# Patient Record
Sex: Female | Born: 1970
Health system: Southern US, Community
[De-identification: ages and names within clinical notes are randomized; demographics above are authoritative.]

## PROBLEM LIST (undated history)

## (undated) HISTORY — PX: ABDOMINAL HYSTERECTOMY: SHX81

---

## 2017-01-22 ENCOUNTER — Emergency Department (HOSPITAL_COMMUNITY)
Admission: EM | Admit: 2017-01-22 | Discharge: 2017-01-22 | Disposition: A | Discharge status: Home / Self Care | Payer: BC Managed Care – PPO | Attending: Emergency Medicine | Admitting: Emergency Medicine

## 2017-01-22 ENCOUNTER — Encounter (HOSPITAL_COMMUNITY): Payer: Self-pay | Admitting: *Deleted

## 2017-01-22 DIAGNOSIS — K029 Dental caries, unspecified: Secondary | ICD-10-CM | POA: Diagnosis not present

## 2017-01-22 DIAGNOSIS — F17200 Nicotine dependence, unspecified, uncomplicated: Secondary | ICD-10-CM | POA: Diagnosis not present

## 2017-01-22 DIAGNOSIS — K047 Periapical abscess without sinus: Secondary | ICD-10-CM | POA: Insufficient documentation

## 2017-01-22 DIAGNOSIS — K0889 Other specified disorders of teeth and supporting structures: Secondary | ICD-10-CM | POA: Diagnosis present

## 2017-01-22 MED ORDER — AMOXICILLIN 500 MG PO CAPS
500.0000 mg | ORAL_CAPSULE | Freq: Once | ORAL | Status: AC
  Administered 2017-01-22: 500 mg via ORAL
  Filled 2017-01-22: qty 1

## 2017-01-22 MED ORDER — HYDROCODONE-ACETAMINOPHEN 5-325 MG PO TABS
1.0000 | ORAL_TABLET | Freq: Once | ORAL | Status: AC
  Administered 2017-01-22: 1 via ORAL
  Filled 2017-01-22: qty 1

## 2017-01-22 MED ORDER — AMOXICILLIN 500 MG PO CAPS: 500.0000 mg | ORAL_CAPSULE | Freq: Three times a day (TID) | ORAL | 0 refills | Status: DC

## 2017-01-22 NOTE — ED Triage Notes (Signed)
Pt complains of left upper dental pain, worse since yesterday. Pt states pain is making her left ear and eyes hurt.

## 2017-01-22 NOTE — ED Notes (Signed)
NP made aware of patient's trending BP. Per NP request, patient educated on hypertension and told to follow up with PCP and have blood pressure rechecked.

## 2017-01-22 NOTE — ED Provider Notes (Signed)
[facility]-EMERGENCY DEPT Provider Note   CSN: [id number] Arrival date & time: 01/22/17  1538     History   Chief Complaint Chief Complaint  Patient presents with  . Dental Pain    HPI Latasha Hernandez is a 46 y.o. female who presents to the ED with dental pain. The pain is located in the left upper dental area. Pain radiates to the left ear. The pain hs been off and on for a couple weeks but controlled with tylenol and ibuprofen. Over the past 24 hours pain has increased.   HPI  History reviewed. No pertinent past medical history.  There are no active problems to display for this patient.   Past Surgical History:  Procedure Laterality Date  . ABDOMINAL HYSTERECTOMY      OB History    No data available       Home Medications    Prior to Admission medications   Medication Sig Start Date End Date Taking? Authorizing Provider  amoxicillin (AMOXIL) 500 MG capsule Take 1 capsule (500 mg total) by mouth 3 (three) times daily. 01/22/17   Janne NapoleonNeese, Jordane Hisle M, NP    Family History No family history on file.  Social History Social History   Tobacco Use  . Smoking status: Current Every Day Smoker  . Smokeless tobacco: Never Used  Substance Use Topics  . Alcohol use: Yes  . Drug use: No     Allergies   Patient has no allergy information on record.   Review of Systems Review of Systems  Constitutional: Negative for chills and fever.  HENT: Positive for dental problem and ear pain.   Respiratory: Negative for cough.   Gastrointestinal: Negative for nausea and vomiting.  Musculoskeletal: Negative for neck pain.  Skin: Negative for wound.  Neurological: Positive for headaches.  Hematological: Positive for adenopathy.  Psychiatric/Behavioral: Negative for confusion.     Physical Exam Updated Vital Signs BP (!) 163/104 (BP Location: Left Arm)   Pulse 78   Temp 98.9 F (37.2 C) (Oral)   Resp 14   Ht 5\' 2"  (1.575 m)   Wt 77.6 kg  (171 lb)   SpO2 96%   BMI 31.28 kg/m   Physical Exam  Constitutional: She is oriented to person, place, and time. She appears well-developed and well-nourished. No distress.  HENT:  Head: Normocephalic.  Right Ear: Tympanic membrane normal.  Left Ear: Tympanic membrane normal.  Nose: Nose normal.  Mouth/Throat: Uvula is midline, oropharynx is clear and moist and mucous membranes are normal. No trismus in the jaw. Dental caries present.    Second upper left molar with decay and tenderness. There is swelling to the gum surrounding the tooth.   Eyes: Conjunctivae and EOM are normal. Pupils are equal, round, and reactive to light.  Neck: Normal range of motion. Neck supple.  Cardiovascular: Normal rate and regular rhythm.  Pulmonary/Chest: Effort normal and breath sounds normal.  Abdominal: Soft. There is no tenderness.  Musculoskeletal: Normal range of motion.  Lymphadenopathy:    She has cervical adenopathy.  Neurological: She is alert and oriented to person, place, and time. No cranial nerve deficit.  Skin: Skin is warm and dry.  Psychiatric: She has a normal mood and affect. Her behavior is normal.  Nursing note and vitals reviewed.    ED Treatments / Results  Labs (all labs ordered are listed, but only abnormal results are displayed) Labs Reviewed - No data to display   Radiology No results found.  Procedures Procedures (including critical care time)  Medications Ordered in ED Medications  amoxicillin (AMOXIL) capsule 500 mg (not administered)  HYDROcodone-acetaminophen (NORCO/VICODIN) 5-325 MG per tablet 1 tablet (not administered)     Initial Impression / Assessment and Plan / ED Course  I have reviewed the triage vital signs and the nursing notes. Patient with toothache.  No gross abscess.  Exam unconcerning for Ludwig's angina or spread of infection.  Will treat with amoxicillin and anti-inflammatories medicine.  Urged patient to follow-up with dentist.     Final Clinical Impressions(s) / ED Diagnoses   Final diagnoses:  Infected dental caries    ED Discharge Orders        Ordered    amoxicillin (AMOXIL) 500 MG capsule  3 times daily     01/22/17 1720       Kerrie Buffaloeese, Cabria Micalizzi Point RobertsM, NP 01/22/17 1726    Charlynne PanderYao, David Hsienta, MD 01/23/17 1459

## 2017-01-22 NOTE — Discharge Instructions (Signed)
Take tylenol and ibuprofen in addition to the antibiotic. Follow up with a dentist as soon as possible. Return here as needed.

## 2018-02-28 ENCOUNTER — Encounter (HOSPITAL_COMMUNITY): Payer: Self-pay

## 2018-02-28 ENCOUNTER — Emergency Department (HOSPITAL_COMMUNITY): Payer: BC Managed Care – PPO

## 2018-02-28 ENCOUNTER — Other Ambulatory Visit: Payer: Self-pay

## 2018-02-28 ENCOUNTER — Emergency Department (HOSPITAL_COMMUNITY)
Admission: EM | Admit: 2018-02-28 | Discharge: 2018-02-28 | Disposition: A | Discharge status: Home / Self Care | Payer: BC Managed Care – PPO | Attending: Emergency Medicine | Admitting: Emergency Medicine

## 2018-02-28 DIAGNOSIS — E079 Disorder of thyroid, unspecified: Secondary | ICD-10-CM

## 2018-02-28 DIAGNOSIS — F17200 Nicotine dependence, unspecified, uncomplicated: Secondary | ICD-10-CM | POA: Diagnosis not present

## 2018-02-28 DIAGNOSIS — R0789 Other chest pain: Secondary | ICD-10-CM | POA: Diagnosis not present

## 2018-02-28 DIAGNOSIS — E0789 Other specified disorders of thyroid: Secondary | ICD-10-CM

## 2018-02-28 LAB — COMPREHENSIVE METABOLIC PANEL
ALT: 17 U/L (ref 0–44)
AST: 21 U/L (ref 15–41)
Albumin: 3.4 g/dL — ABNORMAL LOW (ref 3.5–5.0)
Alkaline Phosphatase: 35 U/L — ABNORMAL LOW (ref 38–126)
Anion gap: 8 (ref 5–15)
BILIRUBIN TOTAL: 0.8 mg/dL (ref 0.3–1.2)
BUN: 9 mg/dL (ref 6–20)
CO2: 25 mmol/L (ref 22–32)
Calcium: 8.1 mg/dL — ABNORMAL LOW (ref 8.9–10.3)
Chloride: 106 mmol/L (ref 98–111)
Creatinine, Ser: 0.67 mg/dL (ref 0.44–1.00)
GFR calc Af Amer: >60 mL/min (ref >60)
GFR calc non Af Amer: >60 mL/min (ref >60)
Glucose, Bld: 99 mg/dL (ref 70–99)
POTASSIUM: 3.1 mmol/L — AB (ref 3.5–5.1)
Sodium: 139 mmol/L (ref 135–145)
TOTAL PROTEIN: 6.7 g/dL (ref 6.5–8.1)

## 2018-02-28 LAB — CBC WITH DIFFERENTIAL/PLATELET
ABS IMMATURE GRANULOCYTES: 0.01 10*3/uL (ref 0.00–0.07)
BASOS ABS: 0 10*3/uL (ref 0.0–0.1)
Basophils Relative: 0 %
EOS PCT: 6 %
Eosinophils Absolute: 0.3 10*3/uL (ref 0.0–0.5)
HEMATOCRIT: 39.5 % (ref 36.0–46.0)
HEMOGLOBIN: 12.9 g/dL (ref 12.0–15.0)
IMMATURE GRANULOCYTES: 0 %
LYMPHS ABS: 1.4 10*3/uL (ref 0.7–4.0)
LYMPHS PCT: 28 %
MCH: 29.1 pg (ref 26.0–34.0)
MCHC: 32.7 g/dL (ref 30.0–36.0)
MCV: 89.2 fL (ref 80.0–100.0)
Monocytes Absolute: 0.7 10*3/uL (ref 0.1–1.0)
Monocytes Relative: 14 %
NRBC: 0 % (ref 0.0–0.2)
Neutro Abs: 2.6 10*3/uL (ref 1.7–7.7)
Neutrophils Relative %: 52 %
Platelets: 222 10*3/uL (ref 150–400)
RBC: 4.43 MIL/uL (ref 3.87–5.11)
RDW: 12.8 % (ref 11.5–15.5)
WBC: 5.1 10*3/uL (ref 4.0–10.5)

## 2018-02-28 LAB — URINALYSIS, ROUTINE W REFLEX MICROSCOPIC
BACTERIA UA: NONE SEEN
Bilirubin Urine: NEGATIVE
Glucose, UA: NEGATIVE mg/dL
Ketones, ur: 5 mg/dL — AB
Leukocytes, UA: NEGATIVE
NITRITE: NEGATIVE
Protein, ur: NEGATIVE mg/dL
Specific Gravity, Urine: 1.008 (ref 1.005–1.030)
pH: 7 (ref 5.0–8.0)

## 2018-02-28 LAB — LIPASE, BLOOD: Lipase: 32 U/L (ref 11–51)

## 2018-02-28 LAB — D-DIMER, QUANTITATIVE (NOT AT ARMC): D DIMER QUANT: 0.6 ug{FEU}/mL — AB (ref 0.00–0.50)

## 2018-02-28 LAB — TROPONIN I: Troponin I: <0.03 ng/mL (ref <0.03)

## 2018-02-28 MED ORDER — IOPAMIDOL (ISOVUE-370) INJECTION 76%
INTRAVENOUS | Status: AC
  Filled 2018-02-28: qty 100

## 2018-02-28 MED ORDER — SODIUM CHLORIDE (PF) 0.9 % IJ SOLN
INTRAMUSCULAR | Status: AC
  Filled 2018-02-28: qty 50

## 2018-02-28 MED ORDER — IOPAMIDOL (ISOVUE-370) INJECTION 76%
100.0000 mL | Freq: Once | INTRAVENOUS | Status: AC | PRN
  Administered 2018-02-28: 100 mL via INTRAVENOUS

## 2018-02-28 NOTE — ED Notes (Signed)
Bed: WA03 Expected date: 02/28/18 Expected time: 7:18 AM Means of arrival: Ambulance Comments: 47 yr chest pain/back pain

## 2018-02-28 NOTE — ED Triage Notes (Signed)
EMS reports from [facility], c/o right sided chest pain and back pain, woke her up from sleep at 0400. Pt states she had cough for several days prior and pain increases with inspiration.  BP 178/115 HR 82 RR 18 Sp02 98 RA  20 L wrist 324 ASA 0.4 mg Nitro with minimal relief

## 2018-02-28 NOTE — Discharge Instructions (Addendum)
As discussed, today's evaluation has been generally reassuring. There are outstanding laboratory studies being performed, and you will be made aware of abnormal findings. Today's evaluation also demonstrated possibility of an abnormality on your thyroid gland. It is important that you follow-up with either your physician or at our primary care clinic to ensure appropriate ongoing evaluation. Please be sure to schedule follow-up with your physician, within the week for further monitoring, management, or return here for any concerning changes in your condition.

## 2018-02-28 NOTE — ED Provider Notes (Signed)
[facility]-EMERGENCY DEPT Provider Note   CSN: [id number] Arrival date & time: 02/28/18  4098     History   Chief Complaint Chief Complaint  Patient presents with  . Chest Pain    HPI Latasha Hernandez is a 48 y.o. female.  HPI Patient presents with right-sided chest pain. The pain woke her from sleeping about 5 hours prior to ED arrival. Pain is right-sided, worse with inspiration, worse with motion, sore, severe, radiating from anterior to posterior. No new fever. She has had a mild cough. Patient denies medical problems, aside from smoking. Notably, the patient has positive contact with an individual who is reportedly positive for tuberculosis Patient also works in a jail. Since onset earlier today, no relief with anything, though it is unclear if she is tried anything specifically. History reviewed. No pertinent past medical history.  There are no active problems to display for this patient.   Past Surgical History:  Procedure Laterality Date  . ABDOMINAL HYSTERECTOMY       OB History   No obstetric history on file.      Home Medications    Prior to Admission medications   Medication Sig Start Date End Date Taking? Authorizing Provider  fluticasone (FLONASE) 50 MCG/ACT nasal spray Place 2 sprays into both nostrils daily as needed for allergies or rhinitis.   Yes [provider]  ibuprofen (ADVIL,MOTRIN) 200 MG tablet Take 600 mg by mouth every 6 (six) hours as needed for headache.   Yes [provider]  amoxicillin (AMOXIL) 500 MG capsule Take 1 capsule (500 mg total) by mouth 3 (three) times daily. Patient not taking: Reported on 02/28/2018 01/22/17   Janne Napoleon, NP    Family History History reviewed. No pertinent family history.  Social History Social History   Tobacco Use  . Smoking status: Current Every Day Smoker  . Smokeless tobacco: Never Used  Substance Use Topics  . Alcohol use: Yes  . Drug  use: No     Allergies   Patient has no known allergies.   Review of Systems Review of Systems  Constitutional:       Per HPI, otherwise negative  HENT:       Per HPI, otherwise negative  Respiratory:       Per HPI, otherwise negative  Cardiovascular:       Per HPI, otherwise negative  Gastrointestinal: Negative for vomiting.  Endocrine:       Negative aside from HPI  Genitourinary:       Neg aside from HPI   Musculoskeletal:       Per HPI, otherwise negative  Skin: Negative.   Neurological: Negative for syncope.     Physical Exam Updated Vital Signs BP (!) 189/103   Pulse 62   Temp 97.6 F (36.4 C) (Oral)   Resp 18   SpO2 99%   Physical Exam Vitals signs and nursing note reviewed.  Constitutional:      General: She is not in acute distress.    Appearance: She is well-developed.  HENT:     Head: Normocephalic and atraumatic.  Eyes:     Conjunctiva/sclera: Conjunctivae normal.  Cardiovascular:     Rate and Rhythm: Normal rate and regular rhythm.  Pulmonary:     Effort: Pulmonary effort is normal. No respiratory distress.     Breath sounds: Normal breath sounds. No stridor.  Abdominal:     General: There is no distension.  Skin:    General:  Skin is warm and dry.  Neurological:     Mental Status: She is alert and oriented to person, place, and time.     Cranial Nerves: No cranial nerve deficit.      ED Treatments / Results  Labs (all labs ordered are listed, but only abnormal results are displayed) Labs Reviewed  COMPREHENSIVE METABOLIC PANEL - Abnormal; Notable for the following components:      Result Value   Potassium 3.1 (*)    Calcium 8.1 (*)    Albumin 3.4 (*)    Alkaline Phosphatase 35 (*)    All other components within normal limits  URINALYSIS, ROUTINE W REFLEX MICROSCOPIC - Abnormal; Notable for the following components:   Color, Urine STRAW (*)    Hgb urine dipstick SMALL (*)    Ketones, ur 5 (*)    All other components within  normal limits  D-DIMER, QUANTITATIVE (NOT [facility]) - Abnormal; Notable for the following components:   D-Dimer, Quant 0.60 (*)    All other components within normal limits  LIPASE, BLOOD  TROPONIN I  CBC WITH DIFFERENTIAL/PLATELET  QUANTIFERON-TB GOLD PLUS    EKG EKG Interpretation  Date/Time:  Tuesday February 28 2018 07:26:18 EST Ventricular Rate:  73 PR Interval:    QRS Duration: 105 QT Interval:  452 QTC Calculation: 499 R Axis:   54 Text Interpretation:  Sinus rhythm Nonspecific T abnormalities, anterior leads Borderline prolonged QT interval Abnormal ekg Confirmed by Gerhard MunchLockwood, Sulaiman Imbert [phone]([id number]) on 02/28/2018 7:30:47 AM   Radiology Dg Chest 2 View  Result Date: 02/28/2018 CLINICAL DATA:  Right-sided chest pain for 6 hours EXAM: CHEST - 2 VIEW COMPARISON:  None. FINDINGS: Normal heart size and mediastinal contours. No acute infiltrate or edema. No effusion or pneumothorax. No acute osseous findings. Remote left sixth rib fracture. IMPRESSION: Negative chest. Electronically Signed   By: Marnee SpringJonathon  Watts M.D.   On: 02/28/2018 08:25   Ct Angio Chest Pe W And/or Wo Contrast  Result Date: 02/28/2018 CLINICAL DATA:  Chest pain. EXAM: CT ANGIOGRAPHY CHEST WITH CONTRAST TECHNIQUE: Multidetector CT imaging of the chest was performed using the standard protocol during bolus administration of intravenous contrast. Multiplanar CT image reconstructions and MIPs were obtained to evaluate the vascular anatomy. CONTRAST:  100mL ISOVUE-370 IOPAMIDOL (ISOVUE-370) INJECTION 76% COMPARISON:  Radiographs of same day. FINDINGS: Cardiovascular: Satisfactory opacification of the pulmonary arteries to the segmental level. No evidence of pulmonary embolism. Normal heart size. No pericardial effusion. Mediastinum/Nodes: The esophagus is unremarkable. Calcified mediastinal lymph nodes are noted concerning for histoplasmosis or granulomatous disease. Large right thyroid lobe or mass is noted which may represent  goiter. This causes tracheal deviation to the left. Lungs/Pleura: Lungs are clear. No pleural effusion or pneumothorax. Upper Abdomen: No acute abnormality. Musculoskeletal: No chest wall abnormality. No acute or significant osseous findings. Review of the MIP images confirms the above findings. IMPRESSION: No definite evidence of pulmonary embolus. Large right thyroid lobe is noted which causes tracheal deviation of the left and may represent goiter, but thyroid ultrasound is recommended to rule out large mass. Calcified mediastinal adenopathy is noted most consistent with prior granulomatous disease. Electronically Signed   By: Lupita RaiderJames  Green Jr, M.D.   On: 02/28/2018 10:53    Procedures Procedures (including critical care time)  Medications Ordered in ED Medications  sodium chloride (PF) 0.9 % injection (has no administration in time range)  iopamidol (ISOVUE-370) 76 % injection (has no administration in time range)  iopamidol (ISOVUE-370) 76 % injection 100  mL (100 mLs Intravenous Contrast Given 02/28/18 1026)     Initial Impression / Assessment and Plan / ED Course  I have reviewed the triage vital signs and the nursing notes.  Pertinent labs & imaging results that were available during my care of the patient were reviewed by me and considered in my medical decision making (see chart for details).     11:34 AM Patient in no distress, states that she feels better. Labs, CT, x-ray reviewed with her at length. No x-ray, CT evidence for pneumonia, PE, pneumothorax. No lab evidence for ACS, though she has mild hypertension, labs are otherwise reassuring, as her vitals. Patient is found to have possible thyroid abnormality, for which she will follow-up. Acknowledges importance of performing this follow-up.  Given resolution of symptoms, generally reassuring findings aside from hypertension, possible thyroid abnormality, patient was discharged in stable condition. Tuberculosis test results not  available, patient also understands importance of awaiting these results, follow-up as needed.  Final Clinical Impressions(s) / ED Diagnoses  Atypical chest pain Thyroid lesion   Gerhard Munch, MD 02/28/18 1135

## 2018-03-02 ENCOUNTER — Encounter: Payer: Self-pay | Admitting: Critical Care Medicine

## 2018-03-02 LAB — QUANTIFERON-TB GOLD PLUS (RQFGPL)
QUANTIFERON TB2 AG VALUE: 0.07 [IU]/mL
QuantiFERON Mitogen Value: >10 IU/mL
QuantiFERON Nil Value: 0.11 IU/mL
QuantiFERON TB1 Ag Value: 0.11 IU/mL

## 2018-03-02 LAB — QUANTIFERON-TB GOLD PLUS: QUANTIFERON-TB GOLD PLUS: NEGATIVE

## 2018-03-21 ENCOUNTER — Encounter: Payer: Self-pay | Admitting: Critical Care Medicine

## 2018-03-21 ENCOUNTER — Ambulatory Visit: Payer: BC Managed Care – PPO | Attending: Critical Care Medicine | Admitting: Critical Care Medicine

## 2018-03-21 VITALS — BP 178/118 | HR 79 | Temp 98.3°F | Ht 62.0 in | Wt 183.6 lb

## 2018-03-21 DIAGNOSIS — Z13220 Encounter for screening for lipoid disorders: Secondary | ICD-10-CM | POA: Diagnosis not present

## 2018-03-21 DIAGNOSIS — R3589 Other polyuria: Secondary | ICD-10-CM

## 2018-03-21 DIAGNOSIS — K029 Dental caries, unspecified: Secondary | ICD-10-CM

## 2018-03-21 DIAGNOSIS — R358 Other polyuria: Secondary | ICD-10-CM

## 2018-03-21 DIAGNOSIS — E049 Nontoxic goiter, unspecified: Secondary | ICD-10-CM

## 2018-03-21 DIAGNOSIS — F17200 Nicotine dependence, unspecified, uncomplicated: Secondary | ICD-10-CM

## 2018-03-21 DIAGNOSIS — K056 Periodontal disease, unspecified: Secondary | ICD-10-CM | POA: Insufficient documentation

## 2018-03-21 DIAGNOSIS — I10 Essential (primary) hypertension: Secondary | ICD-10-CM

## 2018-03-21 DIAGNOSIS — E876 Hypokalemia: Secondary | ICD-10-CM | POA: Insufficient documentation

## 2018-03-21 LAB — POCT URINALYSIS DIP (CLINITEK)
BILIRUBIN UA: NEGATIVE
Blood, UA: NEGATIVE
GLUCOSE UA: NEGATIVE mg/dL
Ketones, POC UA: NEGATIVE mg/dL
Leukocytes, UA: NEGATIVE
NITRITE UA: NEGATIVE
POC PROTEIN,UA: NEGATIVE
Spec Grav, UA: 1.01 (ref 1.010–1.025)
UROBILINOGEN UA: 0.2 U/dL
pH, UA: 7 (ref 5.0–8.0)

## 2018-03-21 MED ORDER — IBUPROFEN 200 MG PO TABS: 400.0000 mg | ORAL_TABLET | Freq: Four times a day (QID) | ORAL | Status: AC | PRN

## 2018-03-21 MED ORDER — NICOTINE POLACRILEX 4 MG MT LOZG: LOZENGE | OROMUCOSAL | 2 refills | Status: AC

## 2018-03-21 MED ORDER — HYDROCHLOROTHIAZIDE 12.5 MG PO TABS: 12.5000 mg | ORAL_TABLET | Freq: Every day | ORAL | 6 refills | Status: AC

## 2018-03-21 MED ORDER — TETANUS-DIPHTH-ACELL PERTUSSIS 5-2.5-18.5 LF-MCG/0.5 IM SUSP: 0.5000 mL | Freq: Once | INTRAMUSCULAR | 0 refills | Status: AC

## 2018-03-21 MED ORDER — AMLODIPINE BESYLATE 10 MG PO TABS: 10.0000 mg | ORAL_TABLET | Freq: Every day | ORAL | 3 refills | Status: AC

## 2018-03-21 MED FILL — NICOTINE 4 MG LOZENGE: 4 | 30 days supply | Qty: 100 | Fill #0

## 2018-03-21 MED FILL — ADACEL 5-2-15.5 LF-MCG/0.5: 5-2-15.5 | 1 days supply | Qty: 1 | Fill #0

## 2018-03-21 MED FILL — AMLODIPINE BESYLATE 10 MG T: 10 | 30 days supply | Qty: 30 | Fill #0

## 2018-03-21 MED FILL — HYDROCHLOROTHIAZIDE 12.5 MG: 12.5 | 30 days supply | Qty: 30 | Fill #0

## 2018-03-21 NOTE — Assessment & Plan Note (Signed)
Ongoing tobacco use  Smoking cessation counseling given and in addition to Chantix the patient will begin nicotine replacement therapy 4 mg 3 times daily by lozenge

## 2018-03-21 NOTE — Patient Instructions (Addendum)
Begin amlodipine 10 mg daily and hydrochlorothiazide 12-1/2 mg daily, these were sent to our pharmacy  Use nicotine lozenge 4 mg,  use 3 times daily allow to dissolve in mouth 2 reduce tobacco use  Follow a Dash diet as below to reduce salt intake and eating healthier foods  A thyroid ultrasound will be obtained  Labs today will include thyroid studies, metabolic panel, lipid screening blood test  We offered you a tetanus vaccine today  A primary care follow-up visit will be made in the next 4 to 6 weeks  You need a dental exam please make an appointment as soon as possible   We will call you with all results   Hypertension Hypertension, commonly called high blood pressure, is when the force of blood pumping through the arteries is too strong. The arteries are the blood vessels that carry blood from the heart throughout the body. Hypertension forces the heart to work harder to pump blood and may cause arteries to become narrow or stiff. Having untreated or uncontrolled hypertension can cause heart attacks, strokes, kidney disease, and other problems. A blood pressure reading consists of a higher number over a lower number. Ideally, your blood pressure should be below 120/80. The first ("top") number is called the systolic pressure. It is a measure of the pressure in your arteries as your heart beats. The second ("bottom") number is called the diastolic pressure. It is a measure of the pressure in your arteries as the heart relaxes. What are the causes? The cause of this condition is not known. What increases the risk? Some risk factors for high blood pressure are under your control. Others are not. Factors you can change  Smoking.  Having type 2 diabetes mellitus, high cholesterol, or both.  Not getting enough exercise or physical activity.  Being overweight.  Having too much fat, sugar, calories, or salt (sodium) in your diet.  Drinking too much alcohol. Factors that are  difficult or impossible to change  Having chronic kidney disease.  Having a family history of high blood pressure.  Age. Risk increases with age.  Race. You may be at higher risk if you are African-American.  Gender. Men are at higher risk than women before age 42. After age 64, women are at higher risk than men.  Having obstructive sleep apnea.  Stress. What are the signs or symptoms? Extremely high blood pressure (hypertensive crisis) may cause:  Headache.  Anxiety.  Shortness of breath.  Nosebleed.  Nausea and vomiting.  Severe chest pain.  Jerky movements you cannot control (seizures). How is this diagnosed? This condition is diagnosed by measuring your blood pressure while you are seated, with your arm resting on a surface. The cuff of the blood pressure monitor will be placed directly against the skin of your upper arm at the level of your heart. It should be measured at least twice using the same arm. Certain conditions can cause a difference in blood pressure between your right and left arms. Certain factors can cause blood pressure readings to be lower or higher than normal (elevated) for a short period of time:  When your blood pressure is higher when you are in a health care provider's office than when you are at home, this is called white coat hypertension. Most people with this condition do not need medicines.  When your blood pressure is higher at home than when you are in a health care provider's office, this is called masked hypertension. Most people with this  condition may need medicines to control blood pressure. If you have a high blood pressure reading during one visit or you have normal blood pressure with other risk factors:  You may be asked to return on a different day to have your blood pressure checked again.  You may be asked to monitor your blood pressure at home for 1 week or longer. If you are diagnosed with hypertension, you may have other  blood or imaging tests to help your health care provider understand your overall risk for other conditions. How is this treated? This condition is treated by making healthy lifestyle changes, such as eating healthy foods, exercising more, and reducing your alcohol intake. Your health care provider may prescribe medicine if lifestyle changes are not enough to get your blood pressure under control, and if:  Your systolic blood pressure is above 130.  Your diastolic blood pressure is above 80. Your personal target blood pressure may vary depending on your medical conditions, your age, and other factors. Follow these instructions at home: Eating and drinking   Eat a diet that is high in fiber and potassium, and low in sodium, added sugar, and fat. An example eating plan is called the DASH (Dietary Approaches to Stop Hypertension) diet. To eat this way: ? Eat plenty of fresh fruits and vegetables. Try to fill half of your plate at each meal with fruits and vegetables. ? Eat whole grains, such as whole wheat pasta, brown rice, or whole grain bread. Fill about one quarter of your plate with whole grains. ? Eat or drink low-fat dairy products, such as skim milk or low-fat yogurt. ? Avoid fatty cuts of meat, processed or cured meats, and poultry with skin. Fill about one quarter of your plate with lean proteins, such as fish, chicken without skin, beans, eggs, and tofu. ? Avoid premade and processed foods. These tend to be higher in sodium, added sugar, and fat.  Reduce your daily sodium intake. Most people with hypertension should eat less than 1,500 mg of sodium a day.  Limit alcohol intake to no more than 1 drink a day for nonpregnant women and 2 drinks a day for men. One drink equals 12 oz of beer, 5 oz of wine, or 1 oz of hard liquor. Lifestyle   Work with your health care provider to maintain a healthy body weight or to lose weight. Ask what an ideal weight is for you.  Get at least 30  minutes of exercise that causes your heart to beat faster (aerobic exercise) most days of the week. Activities may include walking, swimming, or biking.  Include exercise to strengthen your muscles (resistance exercise), such as pilates or lifting weights, as part of your weekly exercise routine. Try to do these types of exercises for 30 minutes at least 3 days a week.  Do not use any products that contain nicotine or tobacco, such as cigarettes and e-cigarettes. If you need help quitting, ask your health care provider.  Monitor your blood pressure at home as told by your health care provider.  Keep all follow-up visits as told by your health care provider. This is important. Medicines  Take over-the-counter and prescription medicines only as told by your health care provider. Follow directions carefully. Blood pressure medicines must be taken as prescribed.  Do not skip doses of blood pressure medicine. Doing this puts you at risk for problems and can make the medicine less effective.  Ask your health care provider about side effects or reactions  to medicines that you should watch for. Contact a health care provider if:  You think you are having a reaction to a medicine you are taking.  You have headaches that keep coming back (recurring).  You feel dizzy.  You have swelling in your ankles.  You have trouble with your vision. Get help right away if:  You develop a severe headache or confusion.  You have unusual weakness or numbness.  You feel faint.  You have severe pain in your chest or abdomen.  You vomit repeatedly.  You have trouble breathing. Summary  Hypertension is when the force of blood pumping through your arteries is too strong. If this condition is not controlled, it may put you at risk for serious complications.  Your personal target blood pressure may vary depending on your medical conditions, your age, and other factors. For most people, a normal blood  pressure is less than 120/80.  Hypertension is treated with lifestyle changes, medicines, or a combination of both. Lifestyle changes include weight loss, eating a healthy, low-sodium diet, exercising more, and limiting alcohol. This information is not intended to replace advice given to you by your health care provider. Make sure you discuss any questions you have with your health care provider. Document Released: 01/18/2005 Document Revised: 12/17/2015 Document Reviewed: 12/17/2015 Elsevier Interactive Patient Education  2019 [employer].

## 2018-03-21 NOTE — Assessment & Plan Note (Signed)
New diagnosis of essential hypertension We will obtain thyroid studies and begin amlodipine 10 mg daily and hydrochlorothiazide 12 and half milligrams daily  Will recheck basic metabolic profile to ascertain if the patient still has hypokalemia

## 2018-03-21 NOTE — Assessment & Plan Note (Signed)
The patient has severe dental caries and periodontal disease  Referral to dentistry was given

## 2018-03-21 NOTE — Assessment & Plan Note (Signed)
Severe periodontal disease  Referral to dentistry given

## 2018-03-21 NOTE — Assessment & Plan Note (Signed)
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

## 2018-03-21 NOTE — Assessment & Plan Note (Signed)
Lipid panel will be obtained to screen for lipid disorder

## 2018-03-21 NOTE — Assessment & Plan Note (Signed)
Potassium 3.1 and previous visit  We will recheck basic metabolic panel today may need to replace

## 2018-03-21 NOTE — Progress Notes (Signed)
Subjective:    Patient ID: Latasha Hernandez, female    DOB: 06/15/1970, 48 y.o.   MRN: [id number]  48 y.o.M with recent ED visit for atypical chest pain on 02/28/18  The patient was having anterior chest pain and had blood pressure of 154/93 in the emergency room.  She had a CT Angie of the chest which showed no pneumonia pulmonary embolism or other active process.  Her troponin was negative.  However the CT angios did reveal a thyroid goiter in the right lobe of the thyroid with displacement of the trachea to the left.  She has not had testing for her thyroid previously.  Note she did have a QuantiFERON gold TB test which was negative on the 02/28/2018 ED visit.  The patient is here today to establish for primary care.  She does not regularly see physicians.  She states her chest pain has now resolved.  She does states she has some shortness of breath is positional if she lays flat.  Her daughter says she will occasionally make noise when she breathes.  Note blood pressure today is elevated at 178/118.  The patient denies any visual changes, headache or other neurologic factors.  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  She is actively smoking.  She is not on any regular medications other than Advil and Flonase. She has not been on previous blood pressure medications.  She does not follow with a dentist regularly.  She does note she has excess urination.  There is no previous history of diabetes.  She states she did have an HIV screening test done in the prison system.  She also states she had a Pap smear done a year ago.  I cannot find records for the Pap smear.  She is not had a tetanus vaccine.   History reviewed. No pertinent past medical history.   History reviewed. No pertinent family history.   Social History   Socioeconomic History  . Marital status: Single    Spouse name: Not on file  . Number of children: Not on file  .  Years of education: Not on file  . Highest education level: Not on file  Occupational History  . Not on file  Social Needs  . Financial resource strain: Not on file  . Food insecurity:    Worry: Not on file    Inability: Not on file  . Transportation needs:    Medical: Not on file    Non-medical: Not on file  Tobacco Use  . Smoking status: Current Every Day Smoker  . Smokeless tobacco: Never Used  Substance and Sexual Activity  . Alcohol use: Yes  . Drug use: No  . Sexual activity: Not on file  Lifestyle  . Physical activity:    Days per week: Not on file    Minutes per session: Not on file  . Stress: Not on file  Relationships  . Social connections:    Talks on phone: Not on file    Gets together: Not on file    Attends religious service: Not on file    Active member of club or organization: Not on file    Attends meetings of clubs or organizations: Not on file    Relationship status: Not on file  . Intimate partner violence:    Fear of current or ex partner: Not on file    Emotionally abused: Not on file  Physically abused: Not on file    Forced sexual activity: Not on file  Other Topics Concern  . Not on file  Social History Narrative  . Not on file     No Known Allergies   Outpatient Medications Prior to Visit  Medication Sig Dispense Refill  . fluticasone (FLONASE) 50 MCG/ACT nasal spray Place 2 sprays into both nostrils daily as needed for allergies or rhinitis.    Marland Kitchen ibuprofen (ADVIL,MOTRIN) 200 MG tablet Take 600 mg by mouth every 6 (six) hours as needed for headache.    Marland Kitchen amoxicillin (AMOXIL) 500 MG capsule Take 1 capsule (500 mg total) by mouth 3 (three) times daily. (Patient not taking: Reported on 02/28/2018) 30 capsule 0   No facility-administered medications prior to visit.       Review of Systems  Constitutional: Negative for fatigue and fever.  HENT: Positive for dental problem. Negative for ear pain, mouth sores, nosebleeds, postnasal drip,  rhinorrhea, sinus pressure, sinus pain, sore throat, tinnitus, trouble swallowing and voice change.   Respiratory: Positive for shortness of breath and stridor. Negative for cough, choking, chest tightness and wheezing.        Positional breath noises when laying  Cardiovascular: Negative for chest pain, palpitations and leg swelling.  Gastrointestinal: Negative.   Endocrine: Positive for cold intolerance, heat intolerance and polyuria.  Genitourinary: Positive for dysuria and urgency. Negative for hematuria.  Neurological: Positive for dizziness and numbness. Negative for tremors, seizures, syncope, speech difficulty, weakness, light-headedness and headaches.       Objective:   Physical Exam Vitals:   03/21/18 0958  BP: (!) 178/118  Pulse: 79  Temp: 98.3 F (36.8 C)  TempSrc: Oral  SpO2: 96%  Weight: 183 lb 9.6 oz (83.3 kg)  Height: 5\' 2"  (1.575 m)    Gen: Pleasant, mildly obese, in no distress,  normal affect  ENT: No lesions,  mouth clear,  oropharynx clear, no postnasal drip, mild nasal turbinate edema, no purulence, severe periodontal disease and multiple carious teeth  Neck: No JVD, no TMG, no carotid bruits the thyroid does not appear enlarged on neck exam  Lungs: No use of accessory muscles, no dullness to percussion, clear without rales or rhonchi, no rhonchi no stridor  Cardiovascular: RRR, heart sounds normal, no murmur or gallops, no peripheral edema  Abdomen: soft and NT, no HSM,  BS normal  Musculoskeletal: No deformities, no cyanosis or clubbing  Neuro: alert, non focal  Skin: Warm, no lesions or rashes  No results found.  1/28 CT Chest: IMPRESSION: No definite evidence of pulmonary embolus.  Large right thyroid lobe is noted which causes tracheal deviation of the left and may represent goiter, but thyroid ultrasound is recommended to rule out large mass.  Calcified mediastinal adenopathy is noted most consistent with prior granulomatous  disease.  BMP Latest Ref Rng & Units 02/28/2018  Glucose 70 - 99 mg/dL 99  BUN 6 - 20 mg/dL 9  Creatinine 2.44 - 6.95 mg/dL 0.72  Sodium 257 - 505 mmol/L 139  Potassium 3.5 - 5.1 mmol/L 3.1(L)  Chloride 98 - 111 mmol/L 106  CO2 22 - 32 mmol/L 25  Calcium 8.9 - 10.3 mg/dL 8.1(L)   CBC Latest Ref Rng & Units 02/28/2018  WBC 4.0 - 10.5 K/uL 5.1  Hemoglobin 12.0 - 15.0 g/dL 18.3  Hematocrit 35.8 - 46.0 % 39.5  Platelets 150 - 400 K/uL 222  Note urinalysis obtained today was normal    Assessment & Plan:  I  personally reviewed all images and lab data in the [facility] system as well as any outside material available during this office visit and agree with the  radiology impressions.   Essential hypertension New diagnosis of essential hypertension We will obtain thyroid studies and begin amlodipine 10 mg daily and hydrochlorothiazide 12 and half milligrams daily  Will recheck basic metabolic profile to ascertain if the patient still has hypokalemia  Hypokalemia Potassium 3.1 and previous visit  We will recheck basic metabolic panel today may need to replace  Dental caries The patient has severe dental caries and periodontal disease  Referral to dentistry was given  Periodontal disease Severe periodontal disease  Referral to dentistry given  Retrosternal thyroid goiter Retrosternal thyroid goiter in the right lobe of the thyroid with displacement of the trachea to the left  Will obtain ultrasound of the thyroid and give consideration for referral to endocrinology and thoracic surgery  Will obtain thyroid profile  Tobacco dependence Ongoing tobacco use  Smoking cessation counseling given and in addition to Chantix the patient will begin nicotine replacement therapy 4 mg 3 times daily by lozenge  Screening for lipid disorders Lipid panel will be obtained to screen for lipid disorder   Donte was seen today for hospitalization follow-up.  Diagnoses and all orders for this  visit:  Essential hypertension -     Basic metabolic panel; Future -     Basic metabolic panel  Retrosternal thyroid goiter -     Thyroid Profile -     US THYROID; Future  Polyuria -     Basic metabolic panel; Future -     POCT URINALYSIS DIP (CLINITEK) -     Basic metabolic panel  Screening for lipid disorders -     Lipid Panel  Tobacco dependence  Periodontal disease  Dental caries  Hypokalemia  Other orders -     ibuprofen (ADVIL,MOTRIN) 200 MG tablet; Take 2 tablets (400 mg total) by mouth every 6 (six) hours as needed for headache. -     amLODipine (NORVASC) 10 MG tablet; Take 1 tablet (10 mg total) by mouth daily. -     hydrochlorothiazide (HYDRODIURIL) 12.5 MG tablet; Take 1 tablet (12.5 mg total) by mouth daily. -     nicotine polacrilex (NICOTINE MINI) 4 MG lozenge; Use three times daily to stop smoking -     Tdap (BOOSTRIX) 5-2.5-18.5 LF-MCG/0.5 injection; Inject 0.5 mLs into the muscle once for 1 dose.    I had an extended discussion with the patient and or family lasting 10 minutes of a 25 minute visit including: Smoking cessation  Note a tetanus vaccine was given this visit

## 2018-03-22 ENCOUNTER — Telehealth: Payer: Self-pay | Admitting: Critical Care Medicine

## 2018-03-22 LAB — BASIC METABOLIC PANEL
BUN/Creatinine Ratio: 11 (ref 9–23)
BUN: 8 mg/dL (ref 6–24)
CO2: 25 mmol/L (ref 20–29)
CREATININE: 0.71 mg/dL (ref 0.57–1.00)
Calcium: 9.4 mg/dL (ref 8.7–10.2)
Chloride: 101 mmol/L (ref 96–106)
GFR calc Af Amer: 116 mL/min/{1.73_m2} (ref >59)
GFR, EST NON AFRICAN AMERICAN: 100 mL/min/{1.73_m2} (ref >59)
Glucose: 79 mg/dL (ref 65–99)
Potassium: 4 mmol/L (ref 3.5–5.2)
SODIUM: 142 mmol/L (ref 134–144)

## 2018-03-22 LAB — THYROID PANEL
FREE THYROXINE INDEX: 2.4 (ref 1.2–4.9)
T3 Uptake Ratio: 29 % (ref 24–39)
T4, Total: 8.4 ug/dL (ref 4.5–12.0)

## 2018-03-22 LAB — LIPID PANEL
CHOLESTEROL TOTAL: 142 mg/dL (ref 100–199)
Chol/HDL Ratio: 2.2 ratio (ref 0.0–4.4)
HDL: 64 mg/dL (ref >39)
LDL Calculated: 62 mg/dL (ref 0–99)
Triglycerides: 78 mg/dL (ref 0–149)
VLDL CHOLESTEROL CAL: 16 mg/dL (ref 5–40)

## 2018-03-22 MED ORDER — LEVOTHYROXINE SODIUM 50 MCG PO TABS: 50.0000 ug | ORAL_TABLET | Freq: Every day | ORAL | 3 refills | Status: DC

## 2018-03-22 MED FILL — LEVOTHYROXINE 50 MCG TABLET: 50 | 30 days supply | Qty: 30 | Fill #0

## 2018-03-22 NOTE — Telephone Encounter (Signed)
I called the patient and let her know lab results  Will start low dose thyroid supp.  F/u thyroid u/s study

## 2018-03-27 ENCOUNTER — Ambulatory Visit (HOSPITAL_COMMUNITY)
Admission: RE | Admit: 2018-03-27 | Discharge: 2018-03-27 | Disposition: A | Discharge status: Home / Self Care | Payer: BC Managed Care – PPO | Source: Ambulatory Visit | Attending: Critical Care Medicine | Admitting: Critical Care Medicine

## 2018-03-27 DIAGNOSIS — E049 Nontoxic goiter, unspecified: Secondary | ICD-10-CM | POA: Diagnosis not present

## 2018-03-29 ENCOUNTER — Telehealth: Payer: Self-pay | Admitting: Critical Care Medicine

## 2018-03-29 DIAGNOSIS — E041 Nontoxic single thyroid nodule: Secondary | ICD-10-CM

## 2018-03-29 NOTE — Telephone Encounter (Signed)
Contacted [facility] and spoke to [place]  Pt is scheduled for her biopsy on April 04, 2018 @3pm  @[place]  [facility] on [address]. Pt is to arrive @245pm .  Contacted pt and left a detailed vm informing pt of appointment. Provided [facility] phone number just in case she needs to reschedule and if she has any questions or concerns to give me a call.

## 2018-03-29 NOTE — Telephone Encounter (Signed)
I tried to call the patient, need to aspirate nodule of her thyroid, biopsy.     I have ordered this and am willing to speak to her.  I left msg on her cell #

## 2018-03-30 NOTE — Telephone Encounter (Signed)
I spoke to the patient and informed her of the result and need for the FNA of the thyroid.  She agrees and will go on March 3 for this procedure.

## 2018-03-30 NOTE — Telephone Encounter (Signed)
Follow up   Pt states she received the voicemail about her results but she has questions and would like to speak to a nurse

## 2018-04-04 ENCOUNTER — Other Ambulatory Visit (HOSPITAL_COMMUNITY)
Admission: RE | Admit: 2018-04-04 | Discharge: 2018-04-04 | Disposition: A | Discharge status: Home / Self Care | Payer: BC Managed Care – PPO | Source: Ambulatory Visit | Attending: Physician Assistant | Admitting: Physician Assistant

## 2018-04-04 ENCOUNTER — Ambulatory Visit
Admission: RE | Admit: 2018-04-04 | Discharge: 2018-04-04 | Disposition: A | Discharge status: Home / Self Care | Payer: BC Managed Care – PPO | Source: Ambulatory Visit | Attending: Critical Care Medicine | Admitting: Critical Care Medicine

## 2018-04-04 DIAGNOSIS — E041 Nontoxic single thyroid nodule: Secondary | ICD-10-CM | POA: Insufficient documentation

## 2018-04-04 NOTE — Procedures (Signed)
PROCEDURE SUMMARY:  Using direct ultrasound guidance, 5 passes were made using 25 g needles into the nodule within the right lobe of the thyroid.   Ultrasound was used to confirm needle placements on all occasions.   EBL = trace  Specimens were sent to Pathology for analysis.  See procedure note under Imaging tab in Epic for full procedure details.  Zamariyah Furukawa S Jelani Trueba PA-C 04/04/2018 3:26 PM

## 2018-04-06 ENCOUNTER — Telehealth: Payer: Self-pay | Admitting: Critical Care Medicine

## 2018-04-06 DIAGNOSIS — E049 Nontoxic goiter, unspecified: Secondary | ICD-10-CM

## 2018-04-06 NOTE — Telephone Encounter (Signed)
Please schedule patient for lab visit for TSH +TFREE

## 2018-04-06 NOTE — Telephone Encounter (Signed)
Pt aware of results  Needs Thyroid eval per endocrinology  Bx benign of nodule  Pt to stay on synthroid for now

## 2018-04-18 ENCOUNTER — Ambulatory Visit: Payer: BC Managed Care – PPO | Admitting: Internal Medicine

## 2018-04-18 ENCOUNTER — Other Ambulatory Visit: Payer: Self-pay

## 2018-04-18 ENCOUNTER — Ambulatory Visit (INDEPENDENT_AMBULATORY_CARE_PROVIDER_SITE_OTHER): Payer: BC Managed Care – PPO | Admitting: Internal Medicine

## 2018-04-18 ENCOUNTER — Encounter: Payer: Self-pay | Admitting: Internal Medicine

## 2018-04-18 VITALS — BP 120/72 | HR 86 | Temp 98.1°F | Ht 62.0 in | Wt 188.2 lb

## 2018-04-18 DIAGNOSIS — E042 Nontoxic multinodular goiter: Secondary | ICD-10-CM | POA: Diagnosis not present

## 2018-04-18 NOTE — Progress Notes (Signed)
Name: Latasha Hernandez  MRN/ DOB: [id number], 06-30-1970    Age/ Sex: 48 y.o., female    PCP: Default, Provider, MD   Reason for Endocrinology Evaluation: MNG     Date of Initial Endocrinology Evaluation: 04/18/2018     HPI: [name] is a 48 y.o. female with a past medical history of HTN and MNG. The patient presented for initial endocrinology clinic visit on 04/18/2018 for consultative assistance with her MNG.   Pt was found to have a right thyroid nodule on CT angio that was performed on 02/28/18 due to c/o chest pain.   Since her scan she has noted the neck enlargement, she has occasional sob and dysphagia, patient also started noticing after her CT scan results.    Today she denies any neck pain, or worsening swelling. She denies any weight changes, fatigue, constipation, depression or anxiety.  She has been on levothyroxine for ~3 weeks.    No family history of thyroid disease.  HISTORY:   Past Medical History: History reviewed. No pertinent past medical history. Past Surgical History:  Past Surgical History:  Procedure Laterality Date  . ABDOMINAL HYSTERECTOMY        Social History:  reports that she has been smoking. She has never used smokeless tobacco. She reports current alcohol use. She reports that she does not use drugs.  Family History: family history is not on file.   HOME MEDICATIONS: Allergies as of 04/18/2018   No Known Allergies     Medication List       Accurate as of April 18, 2018  9:18 AM. Always use your most recent med list.        amLODipine 10 MG tablet Commonly known as:  NORVASC Take 1 tablet (10 mg total) by mouth daily.   fluticasone 50 MCG/ACT nasal spray Commonly known as:  FLONASE Place 2 sprays into both nostrils daily as needed for allergies or rhinitis.   hydrochlorothiazide 12.5 MG tablet Commonly known as:  HYDRODIURIL Take 1 tablet (12.5 mg total) by mouth daily.   ibuprofen 200 MG tablet  Commonly known as:  ADVIL,MOTRIN Take 2 tablets (400 mg total) by mouth every 6 (six) hours as needed for headache.   nicotine polacrilex 4 MG lozenge Commonly known as:  Nicotine Mini Use three times daily to stop smoking         REVIEW OF SYSTEMS: A comprehensive ROS was conducted with the patient and is negative except as per HPI and below:  Review of Systems  Constitutional: Negative for fever, malaise/fatigue and weight loss.  HENT: Negative for nosebleeds and sore throat.   Eyes: Negative for blurred vision and discharge.  Respiratory: Positive for shortness of breath. Negative for cough.   Cardiovascular: Negative for chest pain and palpitations.  Gastrointestinal: Negative for diarrhea and nausea.  Genitourinary: Positive for frequency.  Neurological: Negative for tingling and tremors.  Endo/Heme/Allergies: Negative for polydipsia.  Psychiatric/Behavioral: Negative for depression. The patient is not nervous/anxious.        OBJECTIVE:  VS: BP 120/72 (BP Location: Right Arm, Patient Position: Sitting, Cuff Size: Large)   Pulse 86   Temp 98.1 F (36.7 C) (Oral)   Ht 5\' 2"  (1.575 m)   Wt 188 lb 3.2 oz (85.4 kg)   SpO2 96%   BMI 34.42 kg/m    Wt Readings from Last 3 Encounters:  04/18/18 188 lb 3.2 oz (85.4 kg)  03/21/18 183 lb 9.6 oz (83.3 kg)  01/22/17 171 lb (77.6 kg)     EXAM: General: Pt appears well and is in NAD  Hydration: Well-hydrated with moist mucous membranes and good skin turgor  Eyes: External eye exam normal without stare, lid lag or exophthalmos.  EOM intact.  PERRL.  Ears, Nose, Throat: Hearing: Grossly intact bilaterally Dental: Good dentition  Throat: Clear without mass, erythema or exudate  Neck: General: Supple without adenopathy. Thyroid: Thyroid size normal with a right thyroid nodule noted. No thyroid bruit.  Lungs: Clear with good BS bilat with no rales, rhonchi, or wheezes  Heart: Auscultation: RRR.  Abdomen: Normoactive bowel  sounds, soft, nontender, without masses or organomegaly palpable  Extremities:  BL LE: No pretibial edema normal ROM and strength.  Skin: Hair: Texture and amount normal with gender appropriate distribution Skin Inspection: No rashes. Skin Palpation: Skin temperature, texture, and thickness normal to palpation  Neuro: Cranial nerves: II - XII grossly intact Motor: Normal strength throughout DTRs: 2+ and symmetric in UE without delay in relaxation phase  Mental Status: Judgment, insight: Intact Orientation: Oriented to time, place, and person Mood and affect: No depression, anxiety, or agitation     DATA REVIEWED:  Results for NOHEALANI, LAITY (MRN [id number]) as of 04/18/2018 09:05  Ref. Range 03/21/2018 11:49  Thyroxine (T4) Latest Ref Range: 4.5 - 12.0 ug/dL 8.4  Free Thyroxine Index Latest Ref Range: 1.2 - 4.9  2.4  T3 Uptake Ratio Latest Ref Range: 24 - 39 % 29   CT angio (02/28/2018) No definite evidence of pulmonary embolus.  Large right thyroid lobe is noted which causes tracheal deviation of the left and may represent goiter, but thyroid ultrasound is recommended to rule out large mass.   Thyroid Ultrasound (03/27/2018) . Large 6.5 cm mildly suspicious (TI-RADS category 3) nodule occupying the majority of the right gland meets criteria for consideration of fine-needle aspiration biopsy. 2. Approximately 1.6 cm mildly suspicious (TI-RADS category 3) nodule in the left mid gland meets criteria for follow-up ultrasound in 1 year.    Right thyroid FNA (04/04/2018) Benign ([place] Category II)    Old records , labs and images have been reviewed.    ASSESSMENT/PLAN/RECOMMENDATIONS:   1. Multinodular goiter:  -Patient is clinically and biochemically euthyroid -She does have some local neck symptoms with occasional dysphagia and shortness of breath, I am not sure at this point if this is related to her thyroid nodule, I have reviewed her CT scan images, despite  the left tracheal deviation her trachea is patent.  -I will stop her levothyroxine, the studies have not proven that LT- 4 replacement helps reduce the size of thyroid nodules. Her TFT's were normal in February, 2020. -We have discussed thatdespite her benign cytology, but a possible local neck symptoms, she may decide to go for right lobectomy, patient would like to think about this at this time, we also discussed an alternative or repeating ultrasound in 3 to 6 months to monitor the size of these nodules.   Medications : Stop levothyroxine    Follow-up in 3 months     Signed electronically by: Lyndle Herrlich, MD  [facility] Endocrinology  [facility] [address] [place] [place], [place] [address] Phone: [phone] FAX: [phone]   CC: Default, Provider, MD No address on file Phone: None Fax: None   Return to Endocrinology clinic as below: No future appointments.

## 2018-04-18 NOTE — Patient Instructions (Addendum)
-   Stop Levothyroxine  - Your right thyroid nodule is benign, the only indication for surgery at this time if you have pain , worsening swelling, difficulty swallowing or shortness of breath that you believe is caused by your thyroid nodule.

## 2018-05-26 MED FILL — AMLODIPINE BESYLATE 10 MG T: 10 | 30 days supply | Qty: 30 | Fill #1

## 2018-05-26 MED FILL — HYDROCHLOROTHIAZIDE 12.5 MG: 12.5 | 30 days supply | Qty: 30 | Fill #1

## 2019-05-25 IMAGING — CT CT ANGIO CHEST
2 of 6 series · 19 of 46 positions shown · IV contrast (ISOVUE)
Comparison: Radiographs of same day.

CLINICAL DATA: Chest pain.

EXAM:
CT ANGIOGRAPHY CHEST WITH CONTRAST
TECHNIQUE: Multidetector CT imaging of the chest was performed using the
standard protocol during bolus administration of intravenous
contrast. Multiplanar CT image reconstructions and MIPs were
obtained to evaluate the vascular anatomy.
CONTRAST:  100mL QW3TME-YGS IOPAMIDOL (QW3TME-YGS) INJECTION 76%

[Series 5: thins · axial · 0.62mm/px · z∈[+1376,+1610]mm · 16 of 258 slices shown]
[im 12/258  lung]
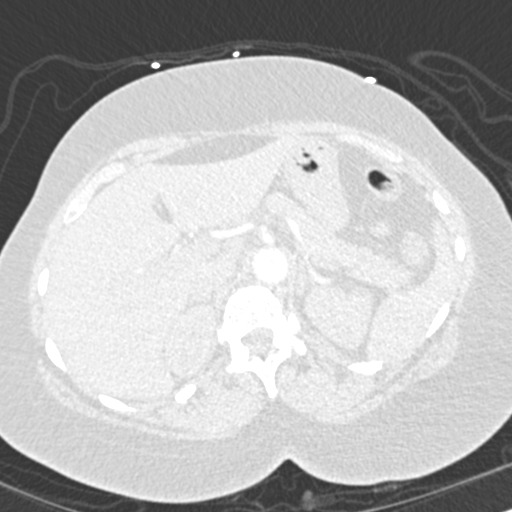
[im 34/258  soft-tissue]
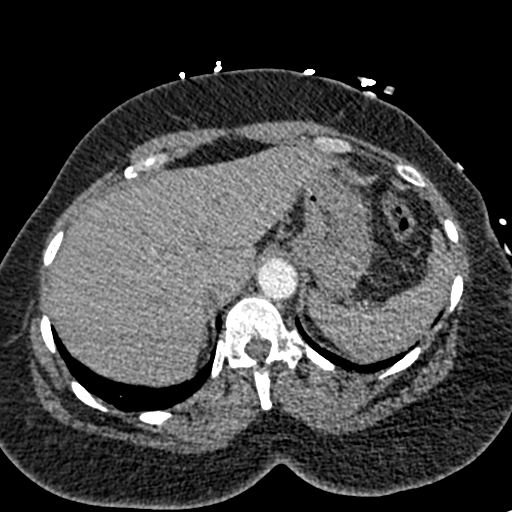
[im 45/258  lung]
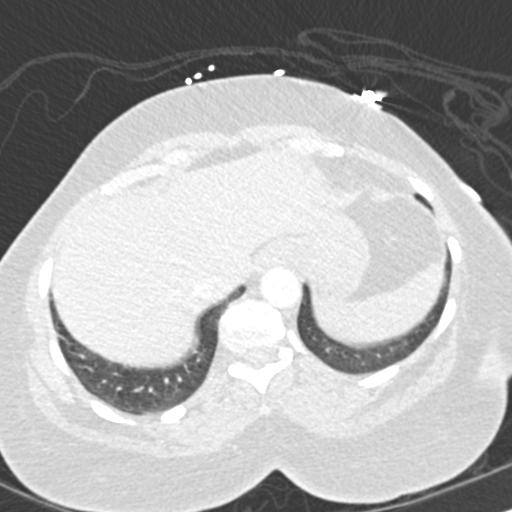
[im 56/258  soft-tissue]
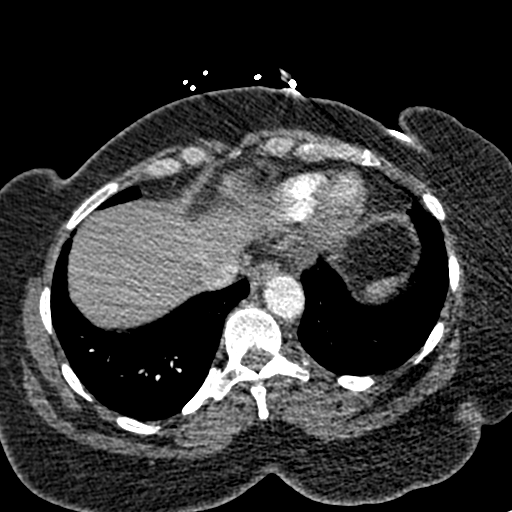
[im 79/258  lung]
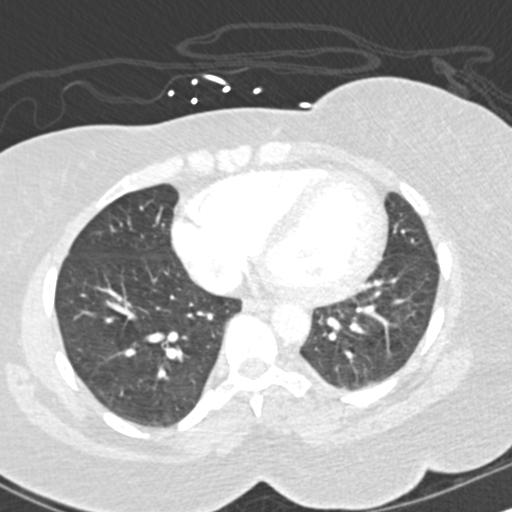
[im 90/258  soft-tissue]
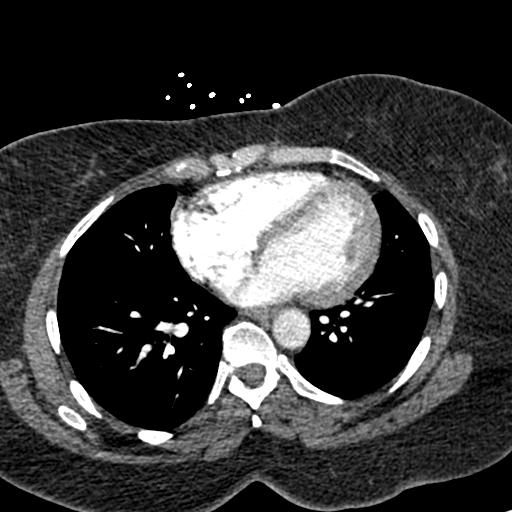
[im 101/258  lung]
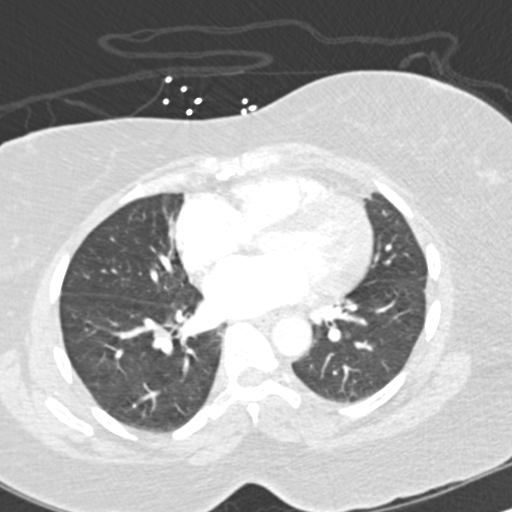
[im 123/258  soft-tissue]
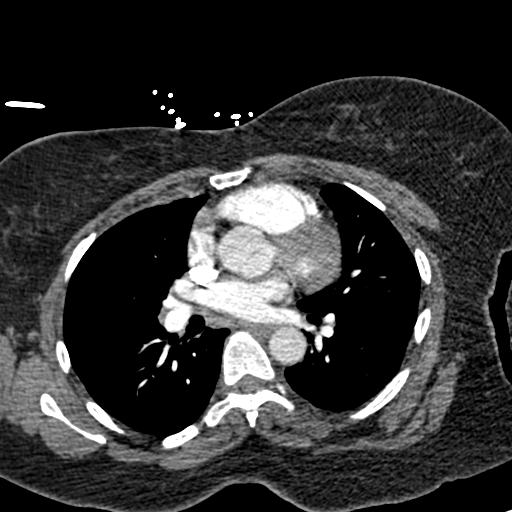
[im 135/258  lung]
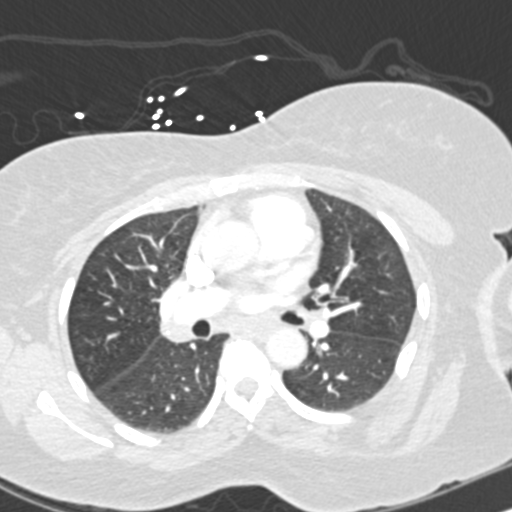
[im 157/258  soft-tissue]
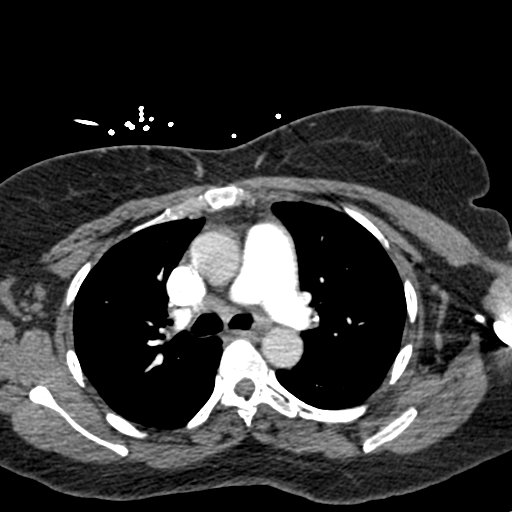
[im 168/258  lung]
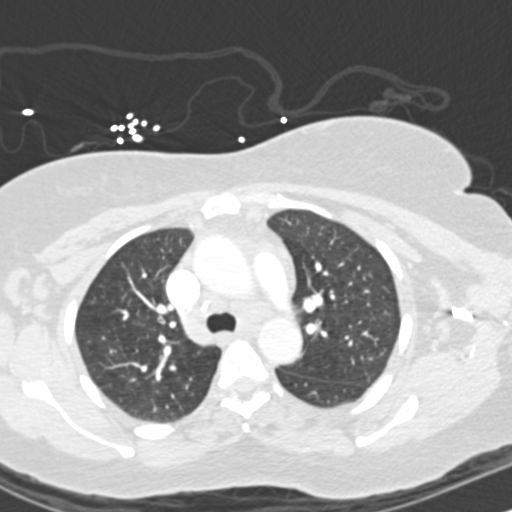
[im 179/258  soft-tissue]
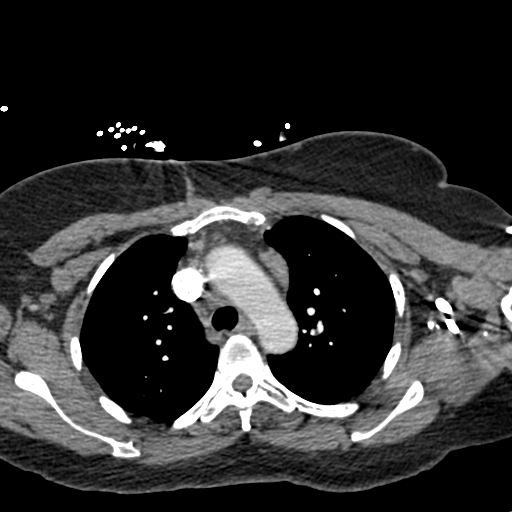
[im 202/258  lung]
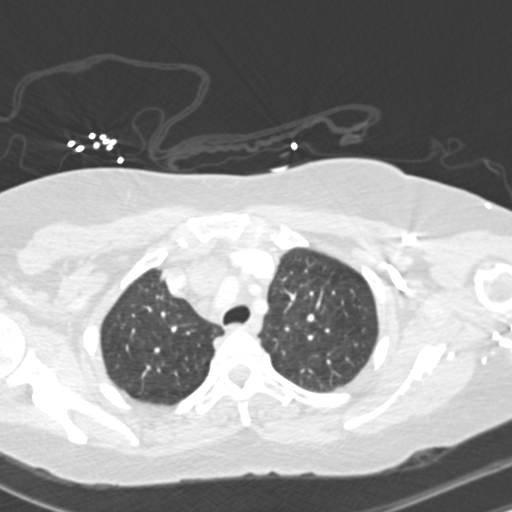
[im 213/258  soft-tissue]
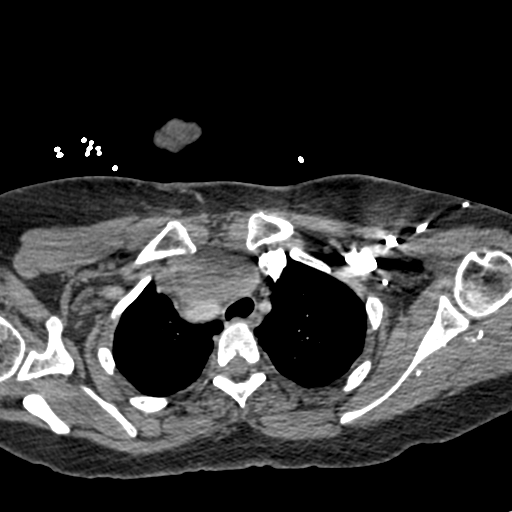
[im 224/258  lung]
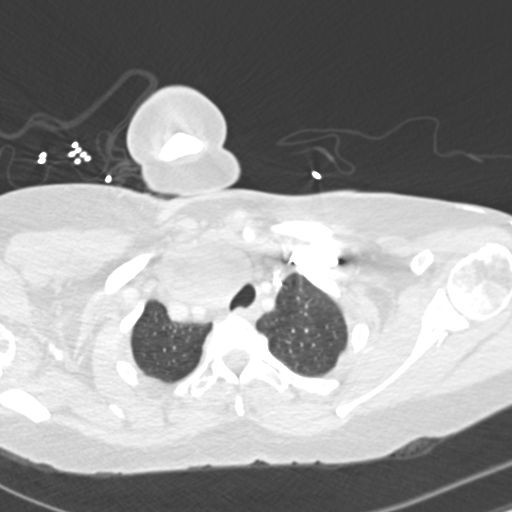
[im 246/258  soft-tissue]
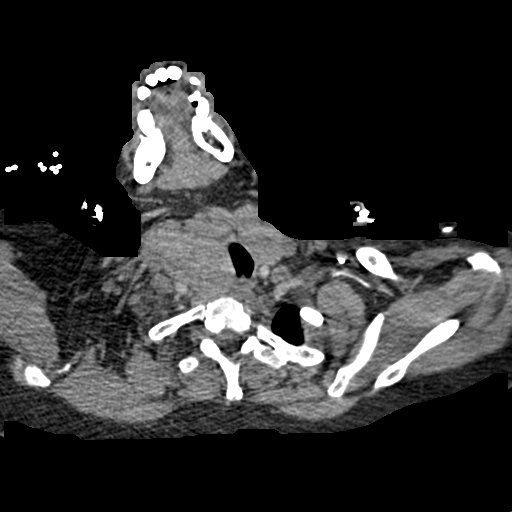

[Series 6: coronal mpr · coronal · 0.53mm/px · 3 of 113 slices shown]
[im 29/113  soft-tissue]
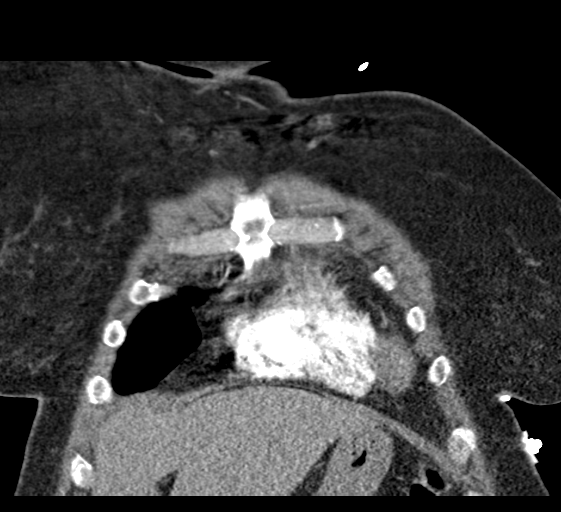
[im 57/113  soft-tissue]
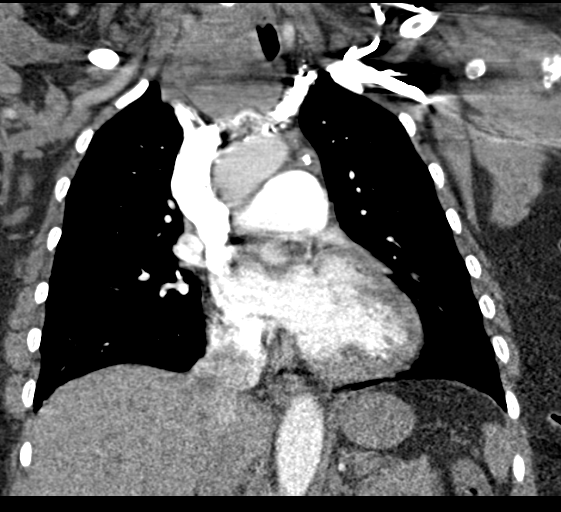
[im 85/113  soft-tissue]
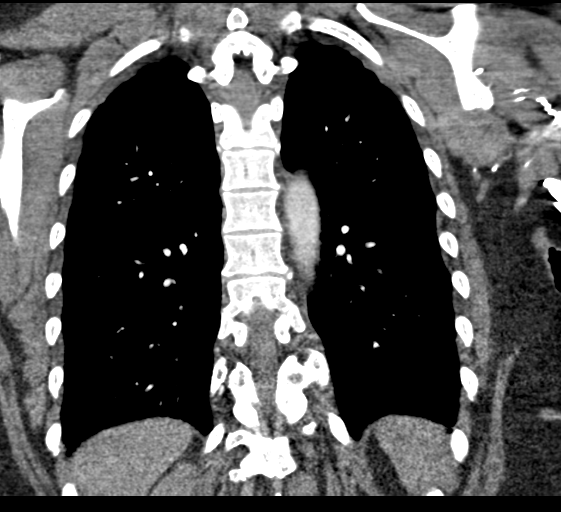

[19 of 46 positions shown; findings below may reference images not displayed]

FINDINGS: Cardiovascular: Satisfactory opacification of the pulmonary arteries
to the segmental level. No evidence of pulmonary embolism. Normal
heart size. No pericardial effusion.

Mediastinum/Nodes: The esophagus is unremarkable. Calcified
mediastinal lymph nodes are noted concerning for histoplasmosis or
granulomatous disease. Large right thyroid lobe or mass is noted
which may represent goiter. This causes tracheal deviation to the
left.

Lungs/Pleura: Lungs are clear. No pleural effusion or pneumothorax.

Upper Abdomen: No acute abnormality.

Musculoskeletal: No chest wall abnormality. No acute or significant
osseous findings.

Review of the MIP images confirms the above findings.
IMPRESSION: No definite evidence of pulmonary embolus.

Large right thyroid lobe is noted which causes tracheal deviation of
the left and may represent goiter, but thyroid ultrasound is
recommended to rule out large mass.

Calcified mediastinal adenopathy is noted most consistent with prior
granulomatous disease.
